# Patient Record
Sex: Female | Born: 1950 | Race: White | Hispanic: No | Marital: Married | Smoking: Former smoker
Health system: Southern US, Community
[De-identification: ages and names within clinical notes are randomized; demographics above are authoritative.]

## PROBLEM LIST (undated history)

## (undated) DIAGNOSIS — E079 Disorder of thyroid, unspecified: Secondary | ICD-10-CM

## (undated) DIAGNOSIS — C801 Malignant (primary) neoplasm, unspecified: Secondary | ICD-10-CM

## (undated) DIAGNOSIS — I10 Essential (primary) hypertension: Secondary | ICD-10-CM

## (undated) HISTORY — PX: APPENDECTOMY: SHX54

## (undated) HISTORY — PX: ABDOMINAL HYSTERECTOMY: SHX81

## (undated) HISTORY — PX: EYE SURGERY: SHX253

## (undated) HISTORY — PX: CHOLECYSTECTOMY: SHX55

## (undated) HISTORY — PX: NEPHRECTOMY: SHX65

---

## 2004-11-10 ENCOUNTER — Ambulatory Visit: Payer: Self-pay | Admitting: Unknown Physician Specialty

## 2005-07-18 ENCOUNTER — Ambulatory Visit: Payer: Self-pay | Admitting: Internal Medicine

## 2005-09-10 ENCOUNTER — Ambulatory Visit: Payer: Self-pay | Admitting: Internal Medicine

## 2006-04-30 ENCOUNTER — Encounter: Payer: Self-pay | Admitting: Internal Medicine

## 2006-05-03 ENCOUNTER — Encounter: Payer: Self-pay | Admitting: Internal Medicine

## 2006-09-23 ENCOUNTER — Ambulatory Visit: Payer: Self-pay | Admitting: Internal Medicine

## 2006-09-27 ENCOUNTER — Ambulatory Visit: Payer: Self-pay | Admitting: Internal Medicine

## 2007-04-02 ENCOUNTER — Ambulatory Visit: Payer: Self-pay | Admitting: Internal Medicine

## 2007-08-18 ENCOUNTER — Ambulatory Visit: Payer: Self-pay | Admitting: Emergency Medicine

## 2007-10-08 ENCOUNTER — Ambulatory Visit: Payer: Self-pay | Admitting: Nurse Practitioner

## 2008-10-18 ENCOUNTER — Ambulatory Visit: Payer: Self-pay | Admitting: Family Medicine

## 2009-10-24 ENCOUNTER — Ambulatory Visit: Payer: Self-pay | Admitting: Family Medicine

## 2010-07-25 ENCOUNTER — Ambulatory Visit: Payer: Self-pay | Admitting: Family Medicine

## 2010-10-10 ENCOUNTER — Ambulatory Visit: Payer: Self-pay | Admitting: Internal Medicine

## 2010-11-16 ENCOUNTER — Ambulatory Visit: Payer: Self-pay | Admitting: Family Medicine

## 2011-02-19 ENCOUNTER — Ambulatory Visit: Payer: Self-pay | Admitting: Internal Medicine

## 2011-04-22 ENCOUNTER — Ambulatory Visit: Payer: Self-pay | Admitting: Internal Medicine

## 2011-09-20 ENCOUNTER — Ambulatory Visit: Payer: Self-pay | Admitting: Internal Medicine

## 2011-12-06 ENCOUNTER — Ambulatory Visit: Payer: Self-pay | Admitting: Family Medicine

## 2011-12-19 ENCOUNTER — Ambulatory Visit: Payer: Self-pay

## 2012-05-15 ENCOUNTER — Ambulatory Visit: Payer: Self-pay | Admitting: Internal Medicine

## 2012-07-28 ENCOUNTER — Ambulatory Visit: Payer: Self-pay | Admitting: Rheumatology

## 2012-09-13 ENCOUNTER — Ambulatory Visit: Payer: Self-pay | Admitting: Internal Medicine

## 2012-10-29 ENCOUNTER — Ambulatory Visit: Payer: Self-pay | Admitting: Unknown Physician Specialty

## 2013-01-13 ENCOUNTER — Ambulatory Visit: Payer: Self-pay | Admitting: Family Medicine

## 2013-01-28 ENCOUNTER — Ambulatory Visit: Payer: Self-pay | Admitting: Family Medicine

## 2013-01-28 ENCOUNTER — Emergency Department: Payer: Self-pay | Admitting: Emergency Medicine

## 2013-01-28 LAB — BASIC METABOLIC PANEL
Anion Gap: 9 (ref 7–16)
BUN: 20 mg/dL — ABNORMAL HIGH (ref 7–18)
Chloride: 101 mmol/L (ref 98–107)
EGFR (African American): >60
Osmolality: 284 (ref 275–301)

## 2013-01-28 LAB — CK TOTAL AND CKMB (NOT AT ARMC)
CK, Total: 68 U/L (ref 21–215)
CK-MB: 0.6 ng/mL (ref 0.5–3.6)

## 2013-01-28 LAB — TROPONIN I: Troponin-I: <0.02 ng/mL

## 2013-01-28 LAB — CBC
HGB: 13.4 g/dL (ref 12.0–16.0)
MCHC: 34.1 g/dL (ref 32.0–36.0)
MCV: 94 fL (ref 80–100)
Platelet: 313 10*3/uL (ref 150–440)

## 2013-03-25 ENCOUNTER — Ambulatory Visit: Payer: Self-pay | Admitting: Family Medicine

## 2013-05-21 ENCOUNTER — Ambulatory Visit: Payer: Self-pay | Admitting: Unknown Physician Specialty

## 2013-07-03 ENCOUNTER — Ambulatory Visit: Payer: Self-pay | Admitting: Unknown Physician Specialty

## 2013-07-26 ENCOUNTER — Ambulatory Visit: Payer: Self-pay

## 2013-11-18 ENCOUNTER — Ambulatory Visit: Payer: Self-pay | Admitting: Family Medicine

## 2013-12-30 ENCOUNTER — Ambulatory Visit: Payer: Self-pay | Admitting: Family Medicine

## 2014-01-13 ENCOUNTER — Ambulatory Visit: Payer: Self-pay | Admitting: Family Medicine

## 2014-02-01 ENCOUNTER — Ambulatory Visit: Payer: Self-pay | Admitting: Hematology and Oncology

## 2014-03-03 ENCOUNTER — Ambulatory Visit: Payer: Self-pay | Admitting: Hematology and Oncology

## 2014-03-29 ENCOUNTER — Ambulatory Visit: Payer: Self-pay | Admitting: Physician Assistant

## 2014-05-10 ENCOUNTER — Ambulatory Visit: Payer: Self-pay | Admitting: Hematology and Oncology

## 2014-05-17 LAB — COMPREHENSIVE METABOLIC PANEL
ALT: 31 U/L (ref 12–78)
Albumin: 3.7 g/dL (ref 3.4–5.0)
Alkaline Phosphatase: 108 U/L
Anion Gap: 7 (ref 7–16)
BUN: 29 mg/dL — ABNORMAL HIGH (ref 7–18)
Bilirubin,Total: 0.3 mg/dL (ref 0.2–1.0)
CREATININE: 1.1 mg/dL (ref 0.60–1.30)
Calcium, Total: 9.5 mg/dL (ref 8.5–10.1)
Chloride: 102 mmol/L (ref 98–107)
Co2: 31 mmol/L (ref 21–32)
EGFR (Non-African Amer.): 53 — ABNORMAL LOW
GLUCOSE: 98 mg/dL (ref 65–99)
OSMOLALITY: 285 (ref 275–301)
Potassium: 4 mmol/L (ref 3.5–5.1)
SGOT(AST): 13 U/L — ABNORMAL LOW (ref 15–37)
Sodium: 140 mmol/L (ref 136–145)
Total Protein: 7.7 g/dL (ref 6.4–8.2)

## 2014-05-17 LAB — LACTATE DEHYDROGENASE: LDH: 166 U/L (ref 81–246)

## 2014-05-17 LAB — CBC CANCER CENTER
BASOS ABS: 0 x10 3/mm (ref 0.0–0.1)
Basophil %: 0.2 %
Eosinophil #: 0.1 x10 3/mm (ref 0.0–0.7)
Eosinophil %: 0.4 %
HCT: 47.1 % — ABNORMAL HIGH (ref 35.0–47.0)
HGB: 15.6 g/dL (ref 12.0–16.0)
Lymphocyte #: 2.8 x10 3/mm (ref 1.0–3.6)
Lymphocyte %: 18 %
MCH: 30.9 pg (ref 26.0–34.0)
MCHC: 33.2 g/dL (ref 32.0–36.0)
MCV: 93 fL (ref 80–100)
MONO ABS: 1 x10 3/mm — AB (ref 0.2–0.9)
Monocyte %: 6.2 %
Neutrophil #: 11.8 x10 3/mm — ABNORMAL HIGH (ref 1.4–6.5)
Neutrophil %: 75.2 %
PLATELETS: 321 x10 3/mm (ref 150–440)
RBC: 5.06 10*6/uL (ref 3.80–5.20)
RDW: 13.4 % (ref 11.5–14.5)
WBC: 15.7 x10 3/mm — AB (ref 3.6–11.0)

## 2014-05-20 ENCOUNTER — Ambulatory Visit: Payer: Self-pay | Admitting: Hematology and Oncology

## 2014-06-02 ENCOUNTER — Ambulatory Visit: Payer: Self-pay | Admitting: Hematology and Oncology

## 2014-06-26 ENCOUNTER — Ambulatory Visit: Payer: Self-pay | Admitting: Physician Assistant

## 2014-06-29 ENCOUNTER — Ambulatory Visit: Payer: Self-pay | Admitting: Family Medicine

## 2014-07-20 ENCOUNTER — Ambulatory Visit: Payer: Self-pay | Admitting: Unknown Physician Specialty

## 2015-08-14 ENCOUNTER — Encounter: Payer: Self-pay | Admitting: *Deleted

## 2015-08-14 ENCOUNTER — Ambulatory Visit
Admission: EM | Admit: 2015-08-14 | Discharge: 2015-08-14 | Disposition: A | Discharge status: Home / Self Care | Payer: Medicare Other | Attending: Family Medicine | Admitting: Family Medicine

## 2015-08-14 DIAGNOSIS — H6593 Unspecified nonsuppurative otitis media, bilateral: Secondary | ICD-10-CM

## 2015-08-14 DIAGNOSIS — J0101 Acute recurrent maxillary sinusitis: Secondary | ICD-10-CM

## 2015-08-14 DIAGNOSIS — J209 Acute bronchitis, unspecified: Secondary | ICD-10-CM | POA: Diagnosis not present

## 2015-08-14 HISTORY — DX: Disorder of thyroid, unspecified: E07.9

## 2015-08-14 HISTORY — DX: Malignant (primary) neoplasm, unspecified: C80.1

## 2015-08-14 HISTORY — DX: Essential (primary) hypertension: I10

## 2015-08-14 MED ORDER — AMOXICILLIN-POT CLAVULANATE 875-125 MG PO TABS: 1.0000 | ORAL_TABLET | Freq: Two times a day (BID) | ORAL | Status: DC

## 2015-08-14 MED ORDER — ACETAMINOPHEN 500 MG PO TABS: 500.0000 mg | ORAL_TABLET | Freq: Four times a day (QID) | ORAL | Status: AC | PRN

## 2015-08-14 MED ORDER — FLUTICASONE PROPIONATE 50 MCG/ACT NA SUSP: 1.0000 | Freq: Two times a day (BID) | NASAL | Status: AC

## 2015-08-14 MED ORDER — SALINE SPRAY 0.65 % NA SOLN: 2.0000 | NASAL | Status: AC

## 2015-08-14 MED ORDER — BENZONATATE 200 MG PO CAPS: 200.0000 mg | ORAL_CAPSULE | Freq: Three times a day (TID) | ORAL | Status: DC | PRN

## 2015-08-14 MED ORDER — HYDROCOD POLST-CPM POLST ER 10-8 MG/5ML PO SUER: 5.0000 mL | Freq: Every evening | ORAL | Status: DC | PRN

## 2015-08-14 NOTE — ED Notes (Signed)
Pt states she has cough, a sinus headache, ear pressure that started a couple of days ago

## 2015-08-14 NOTE — Discharge Instructions (Signed)
Acute Bronchitis Bronchitis is inflammation of the airways that extend from the windpipe into the lungs (bronchi). The inflammation often causes mucus to develop. This leads to a cough, which is the most common symptom of bronchitis.  In acute bronchitis, the condition usually develops suddenly and goes away over time, usually in a couple weeks. Smoking, allergies, and asthma can make bronchitis worse. Repeated episodes of bronchitis may cause further lung problems.  CAUSES Acute bronchitis is most often caused by the same virus that causes a cold. The virus can spread from person to person (contagious) through coughing, sneezing, and touching contaminated objects. SIGNS AND SYMPTOMS   Cough.   Fever.   Coughing up mucus.   Body aches.   Chest congestion.   Chills.   Shortness of breath.   Sore throat.  DIAGNOSIS  Acute bronchitis is usually diagnosed through a physical exam. Your health care provider will also ask you questions about your medical history. Tests, such as chest X-rays, are sometimes done to rule out other conditions.  TREATMENT  Acute bronchitis usually goes away in a couple weeks. Oftentimes, no medical treatment is necessary. Medicines are sometimes given for relief of fever or cough. Antibiotic medicines are usually not needed but may be prescribed in certain situations. In some cases, an inhaler may be recommended to help reduce shortness of breath and control the cough. A cool mist vaporizer may also be used to help thin bronchial secretions and make it easier to clear the chest.  HOME CARE INSTRUCTIONS  Get plenty of rest.   Drink enough fluids to keep your urine clear or pale yellow (unless you have a medical condition that requires fluid restriction). Increasing fluids may help thin your respiratory secretions (sputum) and reduce chest congestion, and it will prevent dehydration.   Take medicines only as directed by your health care provider.  If  you were prescribed an antibiotic medicine, finish it all even if you start to feel better.  Avoid smoking and secondhand smoke. Exposure to cigarette smoke or irritating chemicals will make bronchitis worse. If you are a smoker, consider using nicotine gum or skin patches to help control withdrawal symptoms. Quitting smoking will help your lungs heal faster.   Reduce the chances of another bout of acute bronchitis by washing your hands frequently, avoiding people with cold symptoms, and trying not to touch your hands to your mouth, nose, or eyes.   Keep all follow-up visits as directed by your health care provider.  SEEK MEDICAL CARE IF: Your symptoms do not improve after 1 week of treatment.  SEEK IMMEDIATE MEDICAL CARE IF:  You develop an increased fever or chills.   You have chest pain.   You have severe shortness of breath.  You have bloody sputum.   You develop dehydration.  You faint or repeatedly feel like you are going to pass out.  You develop repeated vomiting.  You develop a severe headache. MAKE SURE YOU:   Understand these instructions.  Will watch your condition.  Will get help right away if you are not doing well or get worse. Document Released: 12/27/2004 Document Revised: 04/05/2014 Document Reviewed: 05/12/2013 [facility] Patient Information 2015 [place], [place]. This information is not intended to replace advice given to you by your health care provider. Make sure you discuss any questions you have with your health care provider. Otitis Media With Effusion Otitis media with effusion is the presence of fluid in the middle ear. This is a common problem in children,  which often follows ear infections. It may be present for weeks or longer after the infection. Unlike an acute ear infection, otitis media with effusion refers only to fluid behind the ear drum and not infection. Children with repeated ear and sinus infections and allergy problems are the most  likely to get otitis media with effusion. CAUSES  The most frequent cause of the fluid buildup is dysfunction of the eustachian tubes. These are the tubes that drain fluid in the ears to the back of the nose (nasopharynx). SYMPTOMS   The main symptom of this condition is hearing loss. As a result, you or your child may:  Listen to the TV at a loud volume.  Not respond to questions.  Ask "what" often when spoken to.  Mistake or confuse one sound or word for another.  There may be a sensation of fullness or pressure but usually not pain. DIAGNOSIS   Your health care provider will diagnose this condition by examining you or your child's ears.  Your health care provider may test the pressure in you or your child's ear with a tympanometer.  A hearing test may be conducted if the problem persists. TREATMENT   Treatment depends on the duration and the effects of the effusion.  Antibiotics, decongestants, nose drops, and cortisone-type drugs (tablets or nasal spray) may not be helpful.  Children with persistent ear effusions may have delayed language or behavioral problems. Children at risk for developmental delays in hearing, learning, and speech may require referral to a specialist earlier than children not at risk.  You or your child's health care provider may suggest a referral to an ear, nose, and throat surgeon for treatment. The following may help restore normal hearing:  Drainage of fluid.  Placement of ear tubes (tympanostomy tubes).  Removal of adenoids (adenoidectomy). HOME CARE INSTRUCTIONS   Avoid secondhand smoke.  Infants who are breastfed are less likely to have this condition.  Avoid feeding infants while they are lying flat.  Avoid known environmental allergens.  Avoid people who are sick. SEEK MEDICAL CARE IF:   Hearing is not better in 3 months.  Hearing is worse.  Ear pain.  Drainage from the ear.  Dizziness. MAKE SURE YOU:   Understand these  instructions.  Will watch your condition.  Will get help right away if you are not doing well or get worse. Document Released: 12/27/2004 Document Revised: 04/05/2014 Document Reviewed: 06/16/2013 [facility] Patient Information 2015 [place], [place]. This information is not intended to replace advice given to you by your health care provider. Make sure you discuss any questions you have with your health care provider. Sinusitis Sinusitis is redness, soreness, and inflammation of the paranasal sinuses. Paranasal sinuses are air pockets within the bones of your face (beneath the eyes, the middle of the forehead, or above the eyes). In healthy paranasal sinuses, mucus is able to drain out, and air is able to circulate through them by way of your nose. However, when your paranasal sinuses are inflamed, mucus and air can become trapped. This can allow bacteria and other germs to grow and cause infection. Sinusitis can develop quickly and last only a short time (acute) or continue over a long period (chronic). Sinusitis that lasts for more than 12 weeks is considered chronic.  CAUSES  Causes of sinusitis include:  Allergies.  Structural abnormalities, such as displacement of the cartilage that separates your nostrils (deviated septum), which can decrease the air flow through your nose and sinuses and affect  sinus drainage.  Functional abnormalities, such as when the small hairs (cilia) that line your sinuses and help remove mucus do not work properly or are not present. SIGNS AND SYMPTOMS  Symptoms of acute and chronic sinusitis are the same. The primary symptoms are pain and pressure around the affected sinuses. Other symptoms include:  Upper toothache.  Earache.  Headache.  Bad breath.  Decreased sense of smell and taste.  A cough, which worsens when you are lying flat.  Fatigue.  Fever.  Thick drainage from your nose, which often is green and may contain pus (purulent).  Swelling and  warmth over the affected sinuses. DIAGNOSIS  Your health care provider will perform a physical exam. During the exam, your health care provider may:  Look in your nose for signs of abnormal growths in your nostrils (nasal polyps).  Tap over the affected sinus to check for signs of infection.  View the inside of your sinuses (endoscopy) using an imaging device that has a light attached (endoscope). If your health care provider suspects that you have chronic sinusitis, one or more of the following tests may be recommended:  Allergy tests.  Nasal culture. A sample of mucus is taken from your nose, sent to a lab, and screened for bacteria.  Nasal cytology. A sample of mucus is taken from your nose and examined by your health care provider to determine if your sinusitis is related to an allergy. TREATMENT  Most cases of acute sinusitis are related to a viral infection and will resolve on their own within 10 days. Sometimes medicines are prescribed to help relieve symptoms (pain medicine, decongestants, nasal steroid sprays, or saline sprays).  However, for sinusitis related to a bacterial infection, your health care provider will prescribe antibiotic medicines. These are medicines that will help kill the bacteria causing the infection.  Rarely, sinusitis is caused by a fungal infection. In theses cases, your health care provider will prescribe antifungal medicine. For some cases of chronic sinusitis, surgery is needed. Generally, these are cases in which sinusitis recurs more than 3 times per year, despite other treatments. HOME CARE INSTRUCTIONS   Drink plenty of water. Water helps thin the mucus so your sinuses can drain more easily.  Use a humidifier.  Inhale steam 3 to 4 times a day (for example, sit in the bathroom with the shower running).  Apply a warm, moist washcloth to your face 3 to 4 times a day, or as directed by your health care provider.  Use saline nasal sprays to help  moisten and clean your sinuses.  Take medicines only as directed by your health care provider.  If you were prescribed either an antibiotic or antifungal medicine, finish it all even if you start to feel better. SEEK IMMEDIATE MEDICAL CARE IF:  You have increasing pain or severe headaches.  You have nausea, vomiting, or drowsiness.  You have swelling around your face.  You have vision problems.  You have a stiff neck.  You have difficulty breathing. MAKE SURE YOU:   Understand these instructions.  Will watch your condition.  Will get help right away if you are not doing well or get worse. Document Released: 11/19/2005 Document Revised: 04/05/2014 Document Reviewed: 12/04/2011 [facility] Patient Information 2015 [place], [place]. This information is not intended to replace advice given to you by your health care provider. Make sure you discuss any questions you have with your health care provider.

## 2015-08-14 NOTE — ED Provider Notes (Signed)
CSN: [id number]     Arrival date & time 08/14/15  1036 History   First MD Initiated Contact with Patient 08/14/15 1123     Chief Complaint  Patient presents with  . Cough  . Headache   (Consider location/radiation/quality/duration/timing/severity/associated sxs/prior Treatment) HPI Comments: Married caucasian female here for evaluation of sinus congestion, cough, headache, insomnia due to cough, ear pressure.  Kidney cancer was in remission 13 years recurred 2015 off chemo x 2 months.  Delsym not helping cough. Ran out of tussionex 3 days ago, leftover from illness last year and would like refill, cough and postnasal drip keep waking her up, ears feel full, glands in neck swollen.  Takes claritin typically in the summer for seasonal allergies prefers azithromycin for sinus infections/bronchitis.  The history is provided by the patient.    Past Medical History  Diagnosis Date  . Thyroid disease   . Hypertension   . Cancer     renal cell    Past Surgical History  Procedure Laterality Date  . Abdominal hysterectomy    . Cholecystectomy    . Appendectomy    . Nephrectomy     No family history on file. Social History  Substance Use Topics  . Smoking status: Former Research scientist (life sciences)  . Smokeless tobacco: None  . Alcohol Use: No   OB History    No data available     Review of Systems  Constitutional: Negative for fever, chills, diaphoresis, activity change, appetite change and fatigue.  HENT: Positive for congestion, ear pain, postnasal drip, rhinorrhea and sinus pressure. Negative for dental problem, drooling, ear discharge, facial swelling, hearing loss, mouth sores, nosebleeds, sneezing, sore throat, tinnitus, trouble swallowing and voice change.   Eyes: Negative for photophobia, pain, discharge, redness, itching and visual disturbance.  Respiratory: Positive for cough and chest tightness. Negative for choking, shortness of breath, wheezing and stridor.   Cardiovascular: Negative for  chest pain, palpitations and leg swelling.  Gastrointestinal: Negative for nausea, vomiting, abdominal pain, diarrhea, constipation, blood in stool and abdominal distention.  Endocrine: Negative for cold intolerance and heat intolerance.  Genitourinary: Negative for dysuria.  Musculoskeletal: Negative for myalgias, back pain, joint swelling, arthralgias, gait problem, neck pain and neck stiffness.  Skin: Negative for color change, pallor, rash and wound.  Allergic/Immunologic: Positive for environmental allergies. Negative for food allergies.  Neurological: Positive for headaches. Negative for dizziness, tremors, seizures, syncope, facial asymmetry, speech difficulty, weakness, light-headedness and numbness.  Hematological: Negative for adenopathy. Does not bruise/bleed easily.  Psychiatric/Behavioral: Positive for sleep disturbance. Negative for behavioral problems, confusion and agitation.    Allergies  Lisinopril  Home Medications   Prior to Admission medications   Medication Sig Start Date End Date Taking? Authorizing Provider  cyanocobalamin 1000 MCG tablet Take 100 mcg by mouth daily.   Yes Historical Provider, MD  levothyroxine (SYNTHROID, LEVOTHROID) 25 MCG tablet Take 12.5 mcg by mouth daily before breakfast.   Yes Historical Provider, MD  magnesium 30 MG tablet Take 30 mg by mouth 2 (two) times daily.   Yes Historical Provider, MD  olmesartan-hydrochlorothiazide (BENICAR HCT) 40-12.5 MG per tablet Take 1 tablet by mouth daily.   Yes Historical Provider, MD  POTASSIUM CHLORIDE PO Take by mouth.   Yes Historical Provider, MD  acetaminophen (TYLENOL) 500 MG tablet Take 1 tablet (500 mg total) by mouth every 6 (six) hours as needed for moderate pain or fever. 08/14/15   Olen Cordial, NP  amoxicillin-clavulanate (AUGMENTIN) 875-125 MG per tablet Take 1  tablet by mouth every 12 (twelve) hours. 08/14/15   Olen Cordial, NP  benzonatate (TESSALON) 200 MG capsule Take 1 capsule (200  mg total) by mouth 3 (three) times daily as needed for cough. 08/14/15   Olen Cordial, NP  chlorpheniramine-HYDROcodone (TUSSIONEX PENNKINETIC ER) 10-8 MG/5ML SUER Take 5 mLs by mouth at bedtime as needed for cough. 08/14/15   Olen Cordial, NP  fluticasone (FLONASE) 50 MCG/ACT nasal spray Place 1 spray into both nostrils 2 (two) times daily. 08/14/15   Olen Cordial, NP  sodium chloride (OCEAN) 0.65 % SOLN nasal spray Place 2 sprays into both nostrils every 2 (two) hours while awake. 08/14/15   Olen Cordial, NP   Meds Ordered and Administered this Visit  Medications - No data to display  BP 140/75 mmHg  Pulse 101  Temp(Src) 98 F (36.7 C) (Oral)  Ht 5' 2.5" (1.588 m)  Wt 131 lb (59.421 kg)  BMI 23.56 kg/m2  SpO2 100% No data found.   Physical Exam  Constitutional: She is oriented to person, place, and time. Vital signs are normal. She appears well-developed and well-nourished. No distress.  HENT:  Head: Normocephalic and atraumatic.  Right Ear: Hearing, external ear and ear canal normal. A middle ear effusion is present.  Left Ear: Hearing, external ear and ear canal normal. A middle ear effusion is present.  Nose: Mucosal edema and rhinorrhea present. No nose lacerations, sinus tenderness, nasal deformity, septal deviation or nasal septal hematoma. No epistaxis.  No foreign bodies. Right sinus exhibits maxillary sinus tenderness. Right sinus exhibits no frontal sinus tenderness. Left sinus exhibits maxillary sinus tenderness. Left sinus exhibits no frontal sinus tenderness.  Mouth/Throat: Uvula is midline and mucous membranes are normal. Mucous membranes are not pale, not dry and not cyanotic. She does not have dentures. No oral lesions. No trismus in the jaw. Normal dentition. No dental abscesses, uvula swelling, lacerations or dental caries. Posterior oropharyngeal edema and posterior oropharyngeal erythema present. No oropharyngeal exudate or tonsillar abscesses.   Bilateral TMs with air fluid level; cobblestoning posterior pharynx; bilateral turbinates with edema/erythema  Eyes: Conjunctivae, EOM and lids are normal. Pupils are equal, round, and reactive to light. Right eye exhibits no discharge. Left eye exhibits no discharge. No scleral icterus.  Neck: Trachea normal and normal range of motion. Neck supple. No tracheal deviation present.  Cardiovascular: Normal rate, regular rhythm, normal heart sounds and intact distal pulses.  Exam reveals no gallop and no friction rub.   No murmur heard. Pulmonary/Chest: Effort normal. No stridor. No respiratory distress. She has decreased breath sounds in the right lower field and the left lower field. She has no wheezes. She has no rhonchi. She has no rales. She exhibits no tenderness.  Negative egophany all fields  Abdominal: Soft. Bowel sounds are normal. She exhibits no shifting dullness, no distension, no pulsatile liver, no fluid wave, no abdominal bruit, no ascites, no pulsatile midline mass and no mass. There is no hepatosplenomegaly. There is no tenderness. There is no rigidity, no rebound, no guarding, no tenderness at McBurney's point and negative Murphy's sign. Hernia confirmed negative in the ventral area.  Dull to percussion x 4 quads  Musculoskeletal: Normal range of motion. She exhibits no edema or tenderness.  Lymphadenopathy:    She has no cervical adenopathy.  Neurological: She is alert and oriented to person, place, and time. She displays no atrophy and no tremor. No cranial nerve deficit or sensory deficit. She exhibits normal muscle  tone. She displays no seizure activity. Coordination and gait normal. GCS eye subscore is 4. GCS verbal subscore is 5. GCS motor subscore is 6.  Skin: Skin is warm, dry and intact. No abrasion, no bruising, no burn, no ecchymosis, no laceration, no lesion, no petechiae and no rash noted. She is not diaphoretic. No cyanosis or erythema. No pallor. Nails show no clubbing.   Psychiatric: She has a normal mood and affect. Her speech is normal and behavior is normal. Judgment and thought content normal. Cognition and memory are normal.  Nursing note and vitals reviewed.   ED Course  Procedures (including critical care time)  Labs Review Labs Reviewed - No data to display  Imaging Review No results found.     MDM   1. Acute recurrent maxillary sinusitis   2. Otitis media with effusion, bilateral   3. Acute bronchitis, unspecified organism    augmentin 875mg  po BID x 10 days.  flonase 1 spray each nostril BID. Nasal saline 2 sprays each nostril q2h while awake  No evidence of systemic bacterial infection, non toxic and well hydrated.  I do not see where any further testing or imaging is necessary at this time.   I will suggest supportive care, rest, good hygiene and encourage the patient to take adequate fluids.  The patient is to return to clinic or EMERGENCY ROOM if symptoms worsen or change significantly.  Exitcare handout on sinusitis given to patient.  Patient verbalized agreement and understanding of treatment plan and had no further questions at this time.   P2:  Hand washing and cover cough  Supportive treatment.   No evidence of invasive bacterial infection, non toxic and well hydrated.  This is most likely self limiting viral infection.  I do not see where any further testing or imaging is necessary at this time.   I will suggest supportive care, rest, good hygiene and encourage the patient to take adequate fluids.  The patient is to return to clinic or EMERGENCY ROOM if symptoms worsen or change significantly e.g. ear pain, fever, purulent discharge from ears or bleeding.  Exitcare handout on otitis media with effusion given to patient.  Patient verbalized agreement and understanding of treatment plan.    Tussionex and tessalon pearles refilled for patient Rx given.  Bronchitis simple, community acquired, may have started as viral (probably  respiratory syncytial, parainfluenza, influenza, or adenovirus), but now evidence of acute purulent bronchitis with resultant bronchial edema and mucus formation.  Differential Diagnoses:  Reactive Airway Disease (asthma, allergic aspergillosis eosinophilia), chronic bronchitis, respiratory infection (sinusitis, common cold, pneumonia), congestive heart failure, smoke/irritant exposure, reflux esophagitis, bronchogenic tumor, and/or aspiration syndromes.  Treating concurently for sinus infection with augmentin 875mg  po BID and flonase 1 spray each nostril BID.  Without high fever, severe dyspnea and lack of physical findings or risk factors will hold on chest radiograph and CBC at this time.  I discussed that approximately 50% of patients with acute bronchitis have a cough that lasts up to three weeks, and 25% for over a month. Tylenol, one to two tablets every four hours as needed for fever or myalgias.   No aspirin. Patient instructed to follow up in one week or sooner if symptoms worsen. Patient verbalized agreement and understanding of treatment plan.  P2:  hand washing and cover cough    Olen Cordial, NP 08/15/15 2111

## 2016-05-23 ENCOUNTER — Other Ambulatory Visit
Admission: RE | Admit: 2016-05-23 | Discharge: 2016-05-23 | Disposition: A | Discharge status: Home / Self Care | Payer: Medicare Other | Source: Ambulatory Visit | Attending: Unknown Physician Specialty | Admitting: Unknown Physician Specialty

## 2016-05-23 DIAGNOSIS — M1711 Unilateral primary osteoarthritis, right knee: Secondary | ICD-10-CM | POA: Insufficient documentation

## 2016-05-23 LAB — SYNOVIAL FLUID, CRYSTAL: Crystals, Fluid: NONE SEEN

## 2016-05-24 ENCOUNTER — Other Ambulatory Visit: Payer: Self-pay | Admitting: Unknown Physician Specialty

## 2016-05-24 DIAGNOSIS — M1711 Unilateral primary osteoarthritis, right knee: Secondary | ICD-10-CM

## 2016-05-29 LAB — BODY FLUID CULTURE: Culture: NO GROWTH

## 2016-06-12 ENCOUNTER — Ambulatory Visit
Admission: RE | Admit: 2016-06-12 | Discharge: 2016-06-12 | Disposition: A | Discharge status: Home / Self Care | Payer: Medicare Other | Source: Ambulatory Visit | Attending: Unknown Physician Specialty | Admitting: Unknown Physician Specialty

## 2016-06-12 DIAGNOSIS — M659 Synovitis and tenosynovitis, unspecified: Secondary | ICD-10-CM | POA: Diagnosis not present

## 2016-06-12 DIAGNOSIS — S83241A Other tear of medial meniscus, current injury, right knee, initial encounter: Secondary | ICD-10-CM | POA: Diagnosis not present

## 2016-06-12 DIAGNOSIS — Z9889 Other specified postprocedural states: Secondary | ICD-10-CM | POA: Diagnosis not present

## 2016-06-12 DIAGNOSIS — X58XXXA Exposure to other specified factors, initial encounter: Secondary | ICD-10-CM | POA: Diagnosis not present

## 2016-06-12 DIAGNOSIS — M25461 Effusion, right knee: Secondary | ICD-10-CM | POA: Insufficient documentation

## 2016-06-12 DIAGNOSIS — M1711 Unilateral primary osteoarthritis, right knee: Secondary | ICD-10-CM | POA: Insufficient documentation

## 2016-10-08 IMAGING — MR MR KNEE*R* W/O CM
6 series · 36 of 40 positions shown · non-contrast
Comparison: 05/21/2013

CLINICAL DATA: Twisting injury 2 years ago. History of arthroscopic
knee surgery 7378. Lateral knee pain and swelling.

EXAM:
MRI OF THE RIGHT KNEE WITHOUT CONTRAST
TECHNIQUE: Multiplanar, multisequence MR imaging of the knee was performed. No
intravenous contrast was administered.

[Series 3: PD fat-sat · axial · 3.0mm · 0.31mm/px · z∈[-59,+40]mm · 9 of 31 slices shown (1 of 4)]
[im 1/31]
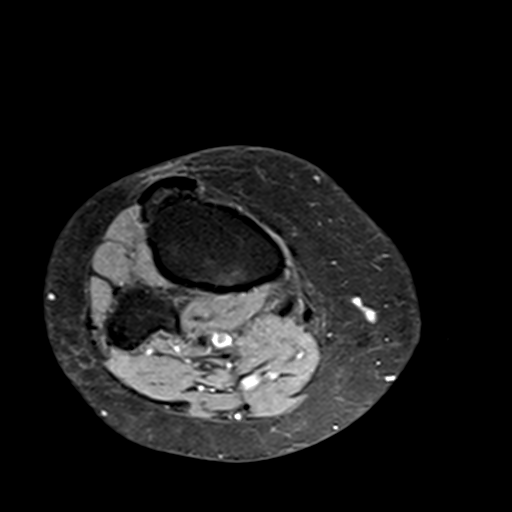
[im 4/31]
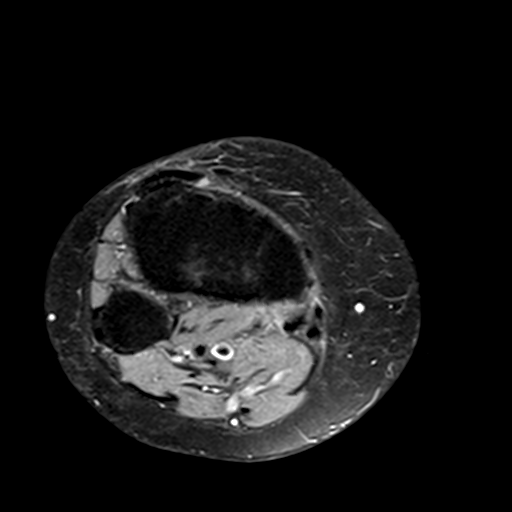
[im 8/31]
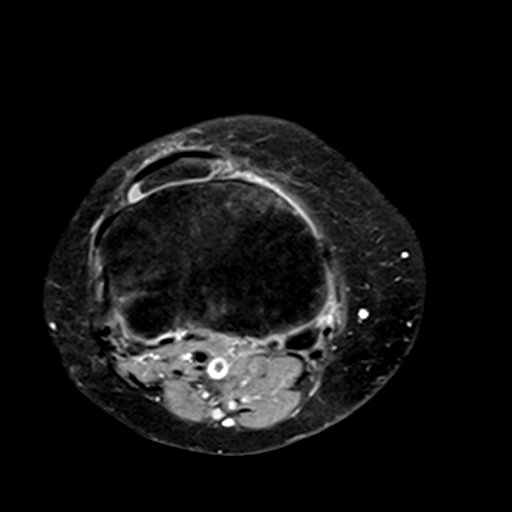
[im 12/31]
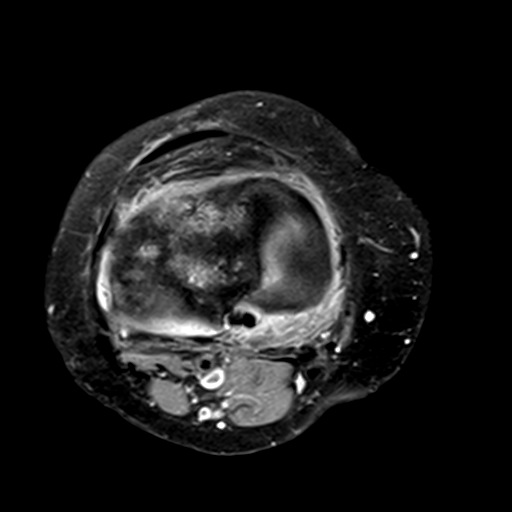
[im 16/31]
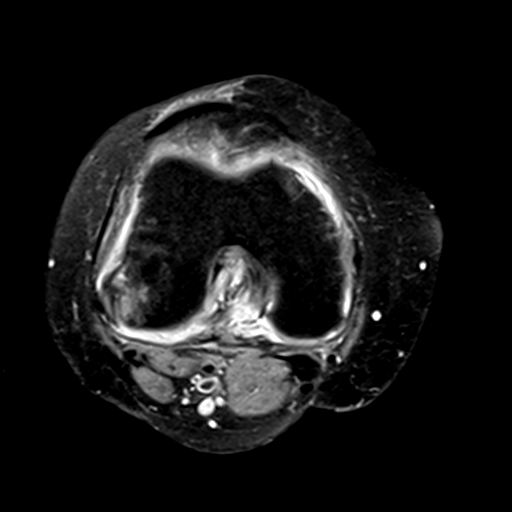
[im 19/31]
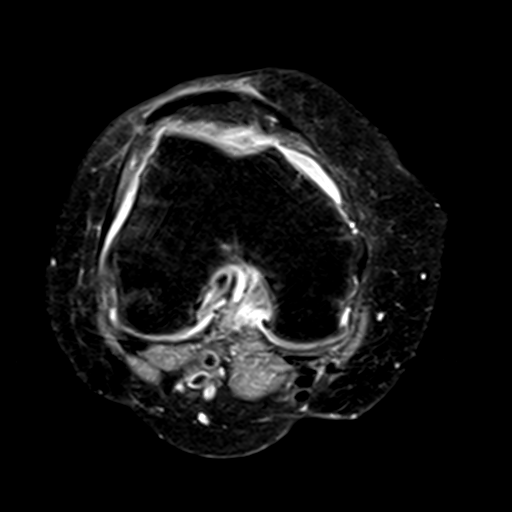
[im 23/31]
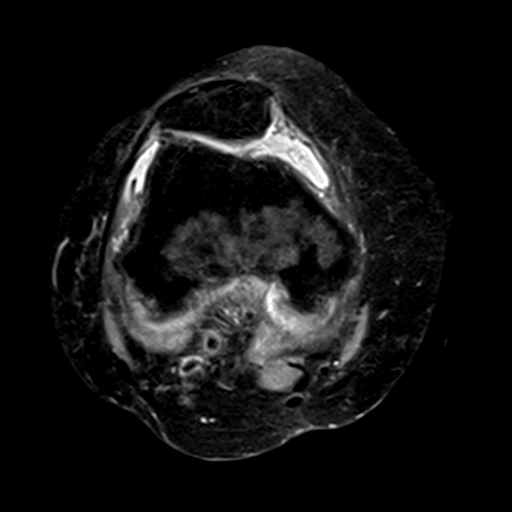
[im 27/31]
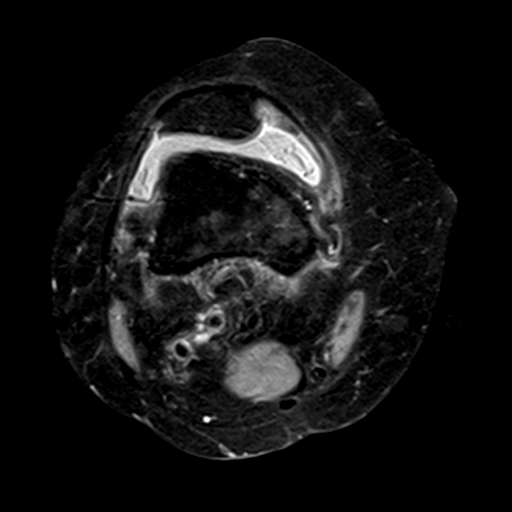
[im 31/31]
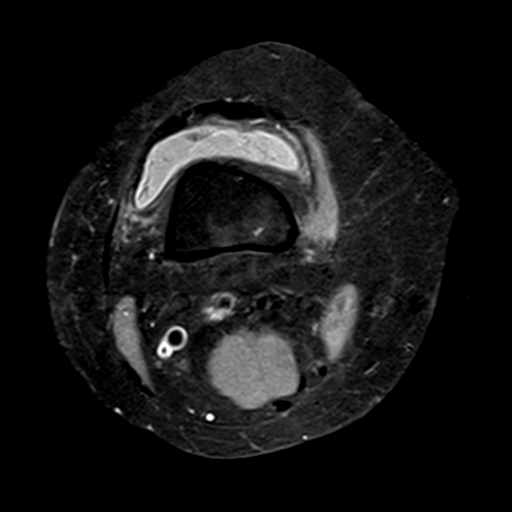

[Series 4: T1 · coronal · 3.0mm · 0.50mm/px · 3 of 30 slices shown]
[im 1/30]
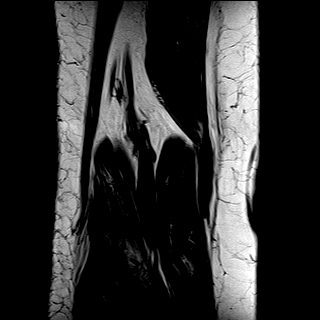
[im 5/30]
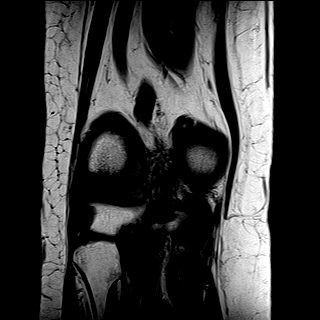
[im 10/30]
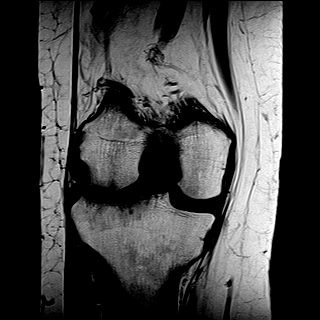

[Series 5: T2 fat-sat · coronal · 3.0mm · 0.50mm/px · 7 of 30 slices shown]
[im 1/30]
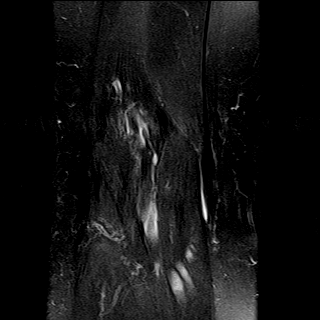
[im 5/30]
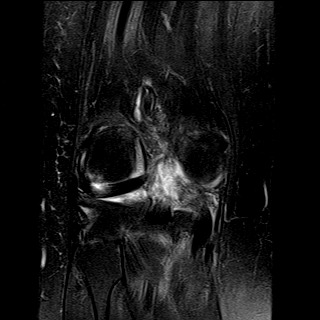
[im 10/30]
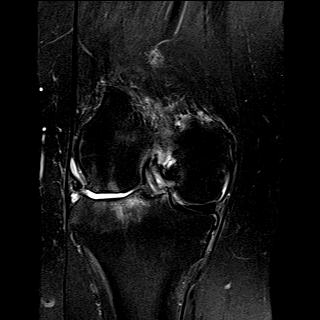
[im 15/30]
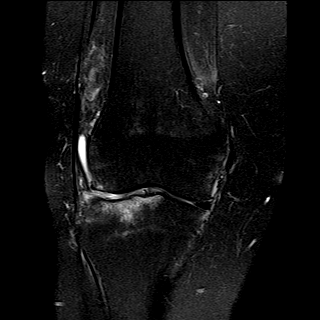
[im 20/30]
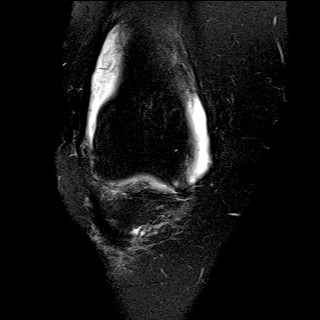
[im 25/30]
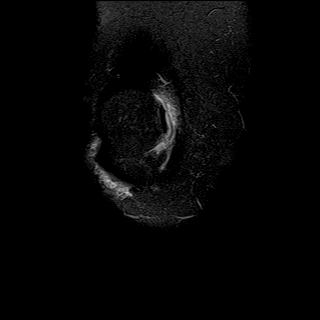
[im 30/30]
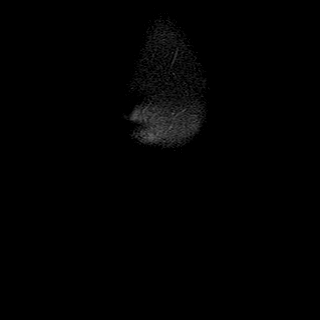

[Series 6: PD fat-sat · sagittal · 3.0mm · 0.62mm/px · 7 of 29 slices shown (2 of 4)]
[im 1/29]
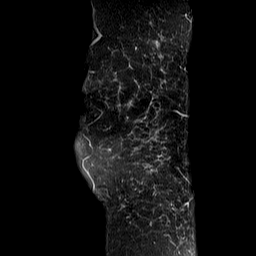
[im 5/29]
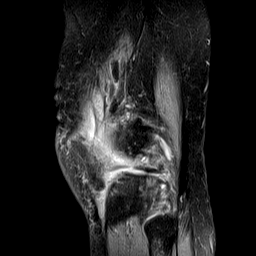
[im 10/29]
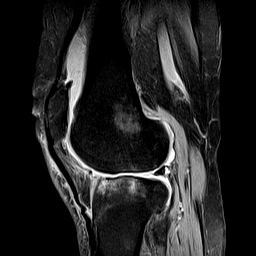
[im 15/29]
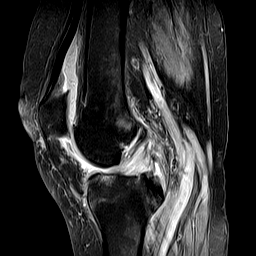
[im 19/29]
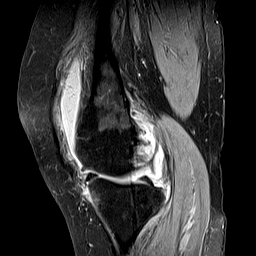
[im 24/29]
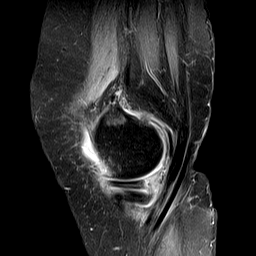
[im 29/29]
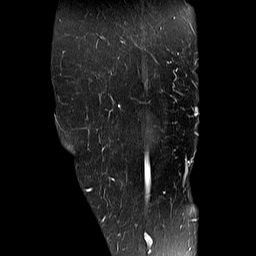

[Series 7: PD fat-sat · coronal · 3.0mm · 0.62mm/px · 7 of 30 slices shown (3 of 4)]
[im 1/30]
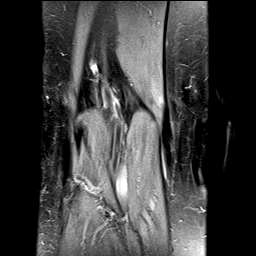
[im 5/30]
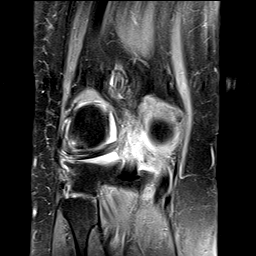
[im 10/30]
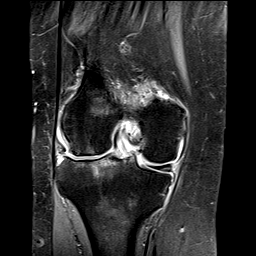
[im 15/30]
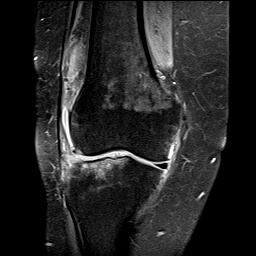
[im 20/30]
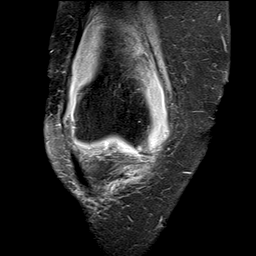
[im 25/30]
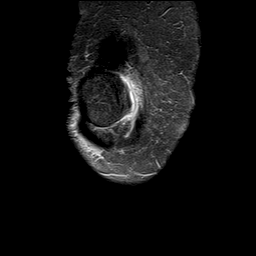
[im 30/30]
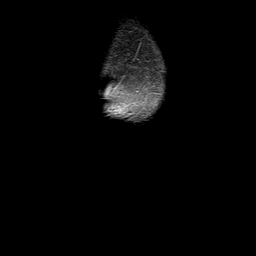

[Series 8: PD fat-sat · oblique · 2.0mm · 0.62mm/px · 3 of 12 slices shown (4 of 4)]
[im 1/12]
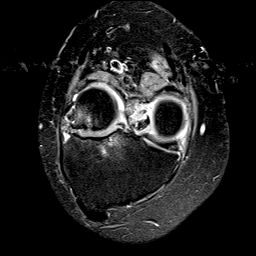
[im 6/12]
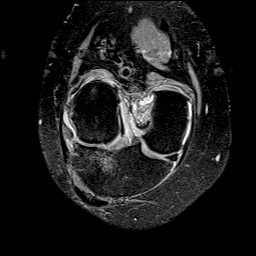
[im 12/12]
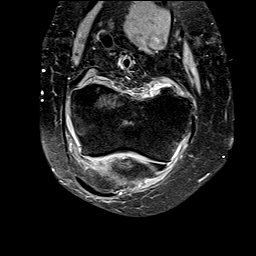

[36 of 40 positions shown; findings below may reference images not displayed]

FINDINGS: MENISCI

Medial meniscus: Intrasubstance degenerative changes and shallow
inferior articular surface tear involving the mid body region.

Lateral meniscus: Chronic surgical changes with a fairly extensive
partial meniscectomy involving the anterior horn. There is a new
oblique coursing superior articular surface tear involving the
posterior horn extending up into the mid body region.

LIGAMENTS

Cruciates: Chronic severe mucoid degeneration of the ACL. The PCL is
intact. Moderate mucoid degeneration.

Collaterals:  Intact.

CARTILAGE

Patellofemoral: Moderate to advanced degenerative chondrosis/
chondromalacia.

Medial: Moderate degenerative chondrosis with early joint space
narrowing and spurring.

Lateral: Advanced degenerative chondrosis with areas of full or near
full-thickness cartilage loss, joint space narrowing, osteophytic
spurring, subchondral cystic change and subchondral edema. These
findings are progressive.

Joint: Large joint effusion and moderate synovitis. Superior and
medial patellar plica are noted.

Popliteal Fossa:  No popliteal mass or Baker's cyst.

Extensor Mechanism: The patella retinacular structures are intact
and the quadriceps and patellar tendons are intact.

Bones:  No acute bony findings.

Other: None
IMPRESSION: 1. Surgical changes from a fairly extensive partial lateral
meniscectomy mainly involving the anterior horn. There is a new
fairly extensive oblique coursing tear involving the posterior horn
and mid body region.
2. Shallow inferior articular surface tear involving the mid body
region of the medial meniscus.
3. Tricompartmental degenerative changes most significant in the
lateral compartment.
4. Chronic mucoid degeneration of the ACL and PCL.
5. Large joint effusion and moderate synovitis.

## 2017-04-10 ENCOUNTER — Ambulatory Visit
Admission: EM | Admit: 2017-04-10 | Discharge: 2017-04-10 | Disposition: A | Discharge status: Home / Self Care | Payer: Medicare Other | Attending: Family Medicine | Admitting: Family Medicine

## 2017-04-10 DIAGNOSIS — Z90710 Acquired absence of both cervix and uterus: Secondary | ICD-10-CM | POA: Diagnosis not present

## 2017-04-10 DIAGNOSIS — Z9049 Acquired absence of other specified parts of digestive tract: Secondary | ICD-10-CM | POA: Insufficient documentation

## 2017-04-10 DIAGNOSIS — Z87891 Personal history of nicotine dependence: Secondary | ICD-10-CM | POA: Insufficient documentation

## 2017-04-10 DIAGNOSIS — E079 Disorder of thyroid, unspecified: Secondary | ICD-10-CM | POA: Insufficient documentation

## 2017-04-10 DIAGNOSIS — Z905 Acquired absence of kidney: Secondary | ICD-10-CM | POA: Insufficient documentation

## 2017-04-10 DIAGNOSIS — J0100 Acute maxillary sinusitis, unspecified: Secondary | ICD-10-CM

## 2017-04-10 DIAGNOSIS — Z8553 Personal history of malignant neoplasm of renal pelvis: Secondary | ICD-10-CM | POA: Insufficient documentation

## 2017-04-10 DIAGNOSIS — I10 Essential (primary) hypertension: Secondary | ICD-10-CM | POA: Insufficient documentation

## 2017-04-10 DIAGNOSIS — R509 Fever, unspecified: Secondary | ICD-10-CM | POA: Diagnosis present

## 2017-04-10 LAB — RAPID STREP SCREEN (MED CTR MEBANE ONLY): Streptococcus, Group A Screen (Direct): NEGATIVE

## 2017-04-10 MED ORDER — AMOXICILLIN 875 MG PO TABS: 875.0000 mg | ORAL_TABLET | Freq: Two times a day (BID) | ORAL | 0 refills | Status: DC

## 2017-04-10 NOTE — ED Provider Notes (Signed)
[facility]    CSN: [id number] Arrival date & time: 04/10/17  1118     History   Chief Complaint Chief Complaint  Patient presents with  . Fever    HPI Sheri Wolf is a 66 y.o. female.   The history is provided by the patient.  Fever  Associated symptoms: congestion and cough   URI  Presenting symptoms: congestion, cough, facial pain and fever   Severity:  Moderate Onset quality:  Sudden Duration:  6 days Timing:  Constant Progression:  Worsening Chronicity:  New Relieved by:  Nothing Ineffective treatments:  OTC medications Associated symptoms: sinus pain   Associated symptoms: no wheezing   Risk factors: being elderly   Risk factors: no chronic cardiac disease, no chronic kidney disease, no chronic respiratory disease, no diabetes mellitus, no immunosuppression, no recent illness and no recent travel     Past Medical History:  Diagnosis Date  . Cancer ([place])    renal cell   . Hypertension   . Thyroid disease     There are no active problems to display for this patient.   Past Surgical History:  Procedure Laterality Date  . ABDOMINAL HYSTERECTOMY    . APPENDECTOMY    . CHOLECYSTECTOMY    . NEPHRECTOMY      OB History    No data available       Home Medications    Prior to Admission medications   Medication Sig Start Date End Date Taking? Authorizing Provider  acetaminophen (TYLENOL) 500 MG tablet Take 1 tablet (500 mg total) by mouth every 6 (six) hours as needed for moderate pain or fever. 08/14/15  Yes Betancourt, Aura Fey, NP  diazepam (DIASTAT ACUDIAL) 10 MG GEL Place rectally once.   Yes [provider]  FLUoxetine (PROZAC) 10 MG tablet Take 10 mg by mouth daily.   Yes [provider]  levETIRAcetam (KEPPRA) 500 MG tablet Take 500 mg by mouth 2 (two) times daily.   Yes [provider]  levothyroxine (SYNTHROID, LEVOTHROID) 25 MCG tablet Take 12.5 mcg by mouth daily before breakfast.   Yes [provider]  losartan-hydrochlorothiazide (HYZAAR) 100-12.5 MG tablet Take 1 tablet by mouth daily.   Yes [provider]  magnesium 30 MG tablet Take 30 mg by mouth 2 (two) times daily.   Yes [provider]  morphine (MSIR) 15 MG tablet Take 15 mg by mouth every 4 (four) hours as needed for severe pain.   Yes [provider]  morphine (MSIR) 30 MG tablet Take 30 mg by mouth every 12 (twelve) hours as needed for severe pain.   Yes [provider]  predniSONE (DELTASONE) 10 MG tablet Take 10 mg by mouth daily with breakfast.   Yes [provider]  ranitidine (ZANTAC) 150 MG tablet Take 150 mg by mouth 2 (two) times daily.   Yes [provider]  triamcinolone lotion (KENALOG) 0.1 % Apply 1 application topically 3 (three) times daily.   Yes [provider]  amoxicillin (AMOXIL) 875 MG tablet Take 1 tablet (875 mg total) by mouth 2 (two) times daily. 04/10/17   Norval Gable, MD  amoxicillin-clavulanate (AUGMENTIN) 875-125 MG per tablet Take 1 tablet by mouth every 12 (twelve) hours. 08/14/15   Betancourt, Aura Fey, NP  benzonatate (TESSALON) 200 MG capsule Take 1 capsule (200 mg total) by mouth 3 (three) times daily as needed for cough. 08/14/15   Betancourt, Aura Fey, NP  chlorpheniramine-HYDROcodone (TUSSIONEX PENNKINETIC ER) 10-8  MG/5ML SUER Take 5 mLs by mouth at bedtime as needed for cough. 08/14/15   Betancourt, Aura Fey, NP  cyanocobalamin 1000 MCG tablet Take 100 mcg by mouth daily.    [provider]  fluticasone (FLONASE) 50 MCG/ACT nasal spray Place 1 spray into both nostrils 2 (two) times daily. 08/14/15   Betancourt, Aura Fey, NP  olmesartan-hydrochlorothiazide (BENICAR HCT) 40-12.5 MG per tablet Take 1 tablet by mouth daily.    [provider]  POTASSIUM CHLORIDE PO Take by mouth.    [provider]  sodium chloride (OCEAN) 0.65 % SOLN nasal spray Place 2 sprays into both nostrils every 2 (two) hours while  awake. 08/14/15   Betancourt, Aura Fey, NP    Family History History reviewed. No pertinent family history.  Social History Social History  Substance Use Topics  . Smoking status: Former Research scientist (life sciences)  . Smokeless tobacco: Never Used  . Alcohol use No     Allergies   Lisinopril   Review of Systems Review of Systems  Constitutional: Positive for fever.  HENT: Positive for congestion and sinus pain.   Respiratory: Positive for cough. Negative for wheezing.      Physical Exam Triage Vital Signs ED Triage Vitals  Enc Vitals Group     BP 04/10/17 1206 138/64     Pulse Rate 04/10/17 1206 (!) 110     Resp 04/10/17 1206 18     Temp 04/10/17 1206 98.7 F (37.1 C)     Temp Source 04/10/17 1206 Oral     SpO2 04/10/17 1206 98 %     Weight 04/10/17 1201 144 lb (65.3 kg)     Height 04/10/17 1201 5' 1.5" (1.562 m)     Head Circumference --      Peak Flow --      Pain Score 04/10/17 1201 7     Pain Loc --      Pain Edu? --      Excl. in [place]? --    No data found.   Updated Vital Signs BP 138/64 (BP Location: Left Arm)   Pulse (!) 110   Temp 98.7 F (37.1 C) (Oral) Comment: took tylenol at 8am  Resp 18   Ht 5' 1.5" (1.562 m)   Wt 144 lb (65.3 kg)   SpO2 98%   BMI 26.77 kg/m   Visual Acuity Right Eye Distance:   Left Eye Distance:   Bilateral Distance:    Right Eye Near:   Left Eye Near:    Bilateral Near:     Physical Exam  Constitutional: She appears well-developed and well-nourished. No distress.  HENT:  Head: Normocephalic and atraumatic.  Right Ear: Tympanic membrane, external ear and ear canal normal.  Left Ear: Tympanic membrane, external ear and ear canal normal.  Nose: Mucosal edema and rhinorrhea present. No nose lacerations, sinus tenderness, nasal deformity, septal deviation or nasal septal hematoma. No epistaxis.  No foreign bodies. Right sinus exhibits maxillary sinus tenderness and frontal sinus tenderness. Left sinus exhibits maxillary sinus tenderness  and frontal sinus tenderness.  Mouth/Throat: Uvula is midline, oropharynx is clear and moist and mucous membranes are normal. No oropharyngeal exudate.  Eyes: Conjunctivae and EOM are normal. Pupils are equal, round, and reactive to light. Right eye exhibits no discharge. Left eye exhibits no discharge. No scleral icterus.  Neck: Normal range of motion. Neck supple. No thyromegaly present.  Cardiovascular: Normal rate, regular rhythm and normal heart sounds.   Pulmonary/Chest: Effort normal and breath sounds  normal. No respiratory distress. She has no wheezes. She has no rales.  Lymphadenopathy:    She has no cervical adenopathy.  Skin: She is not diaphoretic.  Nursing note and vitals reviewed.    UC Treatments / Results  Labs (all labs ordered are listed, but only abnormal results are displayed) Labs Reviewed  RAPID STREP SCREEN (NOT AT [facility])  CULTURE, GROUP A STREP [facility])    EKG  EKG Interpretation None       Radiology No results found.  Procedures Procedures (including critical care time)  Medications Ordered in UC Medications - No data to display   Initial Impression / Assessment and Plan / UC Course  I have reviewed the triage vital signs and the nursing notes.  Pertinent labs & imaging results that were available during my care of the patient were reviewed by me and considered in my medical decision making (see chart for details).       Final Clinical Impressions(s) / UC Diagnoses   Final diagnoses:  Acute maxillary sinusitis, recurrence not specified    New Prescriptions Discharge Medication List as of 04/10/2017  1:07 PM    START taking these medications   Details  amoxicillin (AMOXIL) 875 MG tablet Take 1 tablet (875 mg total) by mouth 2 (two) times daily., Starting Wed 04/10/2017, Normal       1 diagnosis reviewed with patient 2. rx as per orders above; reviewed possible side effects, interactions, risks and benefits  3. Follow-up prn if symptoms  worsen or don't improve   Norval Gable, MD 04/10/17 1402

## 2017-04-10 NOTE — ED Triage Notes (Signed)
Patient complains of fever, sore throat, hoarseness. Patient states that symptoms started yesterday morning.

## 2017-04-13 LAB — CULTURE, GROUP A STREP (THRC)

## 2018-08-24 ENCOUNTER — Other Ambulatory Visit: Payer: Self-pay

## 2018-08-24 ENCOUNTER — Ambulatory Visit
Admission: EM | Admit: 2018-08-24 | Discharge: 2018-08-24 | Disposition: A | Discharge status: Home / Self Care | Payer: Medicare Other | Attending: Family Medicine | Admitting: Family Medicine

## 2018-08-24 ENCOUNTER — Encounter: Payer: Self-pay | Admitting: Gynecology

## 2018-08-24 DIAGNOSIS — R110 Nausea: Secondary | ICD-10-CM | POA: Diagnosis not present

## 2018-08-24 DIAGNOSIS — R197 Diarrhea, unspecified: Secondary | ICD-10-CM | POA: Diagnosis not present

## 2018-08-24 NOTE — ED Provider Notes (Signed)
[facility]    CSN: [id number] Arrival date & time: 08/24/18  1201     History   Chief Complaint Chief Complaint  Patient presents with  . Diarrhea  . Hiatal Hernia    HPI Sheri Wolf is a 67 y.o. female.   67 yo female with a c/o nausea and watery diarrhea since last night. Denies any fevers, chills, abdominal pain, melena, hematochezia. No recent travel or known sick contacts, however did eat out yesterday. Patient states she has a hiatal hernia and is wondering if it could be related to her symptoms.  The history is provided by the patient.    Past Medical History:  Diagnosis Date  . Cancer ([place])    renal cell   . Hypertension   . Thyroid disease     There are no active problems to display for this patient.   Past Surgical History:  Procedure Laterality Date  . ABDOMINAL HYSTERECTOMY    . APPENDECTOMY    . CHOLECYSTECTOMY    . NEPHRECTOMY      OB History   None      Home Medications    Prior to Admission medications   Medication Sig Start Date End Date Taking? Authorizing Provider  acetaminophen (TYLENOL) 500 MG tablet Take 1 tablet (500 mg total) by mouth every 6 (six) hours as needed for moderate pain or fever. 08/14/15  Yes Betancourt, Aura Fey, NP  cyanocobalamin 1000 MCG tablet Take 100 mcg by mouth daily.   Yes [provider]  diazepam (DIASTAT ACUDIAL) 10 MG GEL Place rectally once.   Yes [provider]  FLUoxetine (PROZAC) 10 MG tablet Take 10 mg by mouth daily.   Yes [provider]  fluticasone (FLONASE) 50 MCG/ACT nasal spray Place 1 spray into both nostrils 2 (two) times daily. 08/14/15  Yes Betancourt, Aura Fey, NP  levETIRAcetam (KEPPRA) 500 MG tablet Take 500 mg by mouth 2 (two) times daily.   Yes [provider]  levothyroxine (SYNTHROID, LEVOTHROID) 25 MCG tablet Take 12.5 mcg by mouth daily before breakfast.   Yes [provider]  losartan-hydrochlorothiazide (HYZAAR) 100-12.5 MG  tablet Take 1 tablet by mouth daily.   Yes [provider]  magnesium 30 MG tablet Take 30 mg by mouth 2 (two) times daily.   Yes [provider]  morphine (MSIR) 15 MG tablet Take 15 mg by mouth every 4 (four) hours as needed for severe pain.   Yes [provider]  morphine (MSIR) 30 MG tablet Take 30 mg by mouth every 12 (twelve) hours as needed for severe pain.   Yes [provider]  naloxone [facility]) 2 MG/2ML injection Inject into the muscle. 04/01/17  Yes [provider]  olmesartan-hydrochlorothiazide (BENICAR HCT) 40-12.5 MG per tablet Take 1 tablet by mouth daily.   Yes [provider]  POTASSIUM CHLORIDE PO Take by mouth.   Yes [provider]  predniSONE (DELTASONE) 10 MG tablet Take 10 mg by mouth daily with breakfast.   Yes [provider]  ranitidine (ZANTAC) 150 MG tablet Take 150 mg by mouth 2 (two) times daily.   Yes [provider]  sodium chloride (OCEAN) 0.65 % SOLN nasal spray Place 2 sprays into both nostrils every 2 (two) hours while awake. 08/14/15  Yes Betancourt, Aura Fey, NP  triamcinolone lotion (KENALOG) 0.1 % Apply 1 application topically 3 (three) times daily.   Yes [provider]  amoxicillin (AMOXIL) 875 MG tablet Take 1  tablet (875 mg total) by mouth 2 (two) times daily. 04/10/17   Norval Gable, MD  amoxicillin-clavulanate (AUGMENTIN) 875-125 MG per tablet Take 1 tablet by mouth every 12 (twelve) hours. 08/14/15   Betancourt, Aura Fey, NP  benzonatate (TESSALON) 200 MG capsule Take 1 capsule (200 mg total) by mouth 3 (three) times daily as needed for cough. 08/14/15   Betancourt, Aura Fey, NP  chlorpheniramine-HYDROcodone (TUSSIONEX PENNKINETIC ER) 10-8 MG/5ML SUER Take 5 mLs by mouth at bedtime as needed for cough. 08/14/15   Betancourt, Aura Fey, NP    Family History History reviewed. No pertinent family history.  Social History Social History   Tobacco Use  . Smoking status:  Former Research scientist (life sciences)  . Smokeless tobacco: Never Used  Substance Use Topics  . Alcohol use: No  . Drug use: No     Allergies   Lisinopril   Review of Systems Review of Systems   Physical Exam Triage Vital Signs ED Triage Vitals  Enc Vitals Group     BP 08/24/18 1228 (!) 149/96     Pulse Rate 08/24/18 1228 (!) 105     Resp 08/24/18 1228 16     Temp 08/24/18 1228 98.2 F (36.8 C)     Temp Source 08/24/18 1228 Oral     SpO2 08/24/18 1228 96 %     Weight 08/24/18 1222 151 lb (68.5 kg)     Height 08/24/18 1222 5\' 2"  (1.575 m)     Head Circumference --      Peak Flow --      Pain Score 08/24/18 1222 6     Pain Loc --      Pain Edu? --      Excl. in [place]? --    No data found.  Updated Vital Signs BP (!) 149/96 (BP Location: Left Arm)   Pulse (!) 105   Temp 98.2 F (36.8 C) (Oral)   Resp 16   Ht 5\' 2"  (1.575 m)   Wt 68.5 kg   SpO2 96%   BMI 27.62 kg/m   Visual Acuity Right Eye Distance:   Left Eye Distance:   Bilateral Distance:    Right Eye Near:   Left Eye Near:    Bilateral Near:     Physical Exam  Constitutional: She appears well-developed and well-nourished. No distress.  Pulmonary/Chest: Effort normal. No stridor. No respiratory distress.  Abdominal: Soft. Bowel sounds are normal. She exhibits no distension and no mass. There is no tenderness. There is no rebound and no guarding. No hernia.  Skin: No rash noted. She is not diaphoretic.  Nursing note and vitals reviewed.    UC Treatments / Results  Labs (all labs ordered are listed, but only abnormal results are displayed) Labs Reviewed - No data to display  EKG None  Radiology No results found.  Procedures Procedures (including critical care time)  Medications Ordered in UC Medications - No data to display  Initial Impression / Assessment and Plan / UC Course  I have reviewed the triage vital signs and the nursing notes.  Pertinent labs & imaging results that were available during my care  of the patient were reviewed by me and considered in my medical decision making (see chart for details).      Final Clinical Impressions(s) / UC Diagnoses   Final diagnoses:  Diarrhea, unspecified type  Nausea     Discharge Instructions     Imodium AD as needed for diarrhea Zofran 8mg  as needed every 8  hours Clear liquids today then advance slowly as tolerated    ED Prescriptions    None     1. Labs/x-ray results and diagnosis reviewed with patient/parent/guardian/family 2. rx as per orders above; reviewed possible side effects, interactions, risks and benefits  3. Recommend supportive treatment as above 4. Follow-up prn if symptoms worsen or don't improve    Controlled Substance Prescriptions [place] Controlled Substance Registry consulted? Not Applicable   Norval Gable, MD 08/24/18 [id number]

## 2018-08-24 NOTE — ED Triage Notes (Signed)
Patient c/o diarrhea and nausea x 2 days. Per patient with hiatal hernia.

## 2018-08-24 NOTE — Discharge Instructions (Signed)
Imodium AD as needed for diarrhea Zofran 8mg  as needed every 8 hours Clear liquids today then advance slowly as tolerated

## 2019-05-30 ENCOUNTER — Encounter: Payer: Self-pay | Admitting: Emergency Medicine

## 2019-05-30 ENCOUNTER — Ambulatory Visit
Admission: EM | Admit: 2019-05-30 | Discharge: 2019-05-30 | Disposition: A | Discharge status: Home / Self Care | Payer: Medicare Other | Attending: Family Medicine | Admitting: Family Medicine

## 2019-05-30 ENCOUNTER — Other Ambulatory Visit: Payer: Self-pay

## 2019-05-30 DIAGNOSIS — R1013 Epigastric pain: Secondary | ICD-10-CM

## 2019-05-30 DIAGNOSIS — R109 Unspecified abdominal pain: Secondary | ICD-10-CM | POA: Diagnosis not present

## 2019-05-30 MED ORDER — HYDROCODONE-ACETAMINOPHEN 5-325 MG PO TABS
1.0000 | ORAL_TABLET | Freq: Three times a day (TID) | ORAL | 0 refills | Status: AC | PRN
Start: 2019-05-30 — End: ?

## 2019-05-30 NOTE — Discharge Instructions (Signed)
Pain medication as directed.  Call your Doctor Monday am for referral.  Take care  Dr. Lacinda Axon

## 2019-05-30 NOTE — ED Triage Notes (Signed)
Patient c/o left sided upper abdominal pain off and on for 2 weeks.  Patient states that she saw her PCP on June 4 and her H. Pylori test was Negative.  Patient states that she is receiving cancer treatment.  Patient denies N/V/D.  Patient denies fevers.  Patient denies hard stools.

## 2019-05-31 NOTE — ED Provider Notes (Signed)
[facility]    CSN: [id number] Arrival date & time: 05/30/19  1355      History   Chief Complaint Chief Complaint  Patient presents with  . Abdominal Pain    HPI    68 year old female presents with abdominal pain.  Patient has extensive past medical history.  She has had ongoing abdominal pain for the past month.  She has been seen by her primary care physician.  H. pylori testing was negative.  She was placed on Carafate.  Patient has been continuing Protonix.  Patient reports continued abdominal pain.  Located in the epigastric region and left upper quadrant.  Worse with past 2 weeks.  Patient is unsure of the cause.  No nausea, vomiting, diarrhea.  She is concerned that it may be from the fact that her pain medications have been changed.  She has went from a substantial amount of long-acting and short acting morphine to hydrocodone.  She is now out of her pain medication.  Her pain is 7/10 in severity.  Denies constipation.  No other associated symptoms.  No other complaints.  History reviewed and updated as below. PMH: Hypertension    GERD (gastroesophageal reflux disease)    Irritable bowel syndrome    Renal cell carcinoma (CMS-HCC)    Mitral regurgitation    Rosacea    Hemorrhoids    Psoriatic arthritis (CMS-HCC) [place] a. S/p Sulfasalazine/nausea/vomiting.  Osteoarthritis [place] a. Knees.  Diverticulitis    Psoriasis    Allergic state    Anxiety    Renal cell carcinoma (CMS-HCC)    Maintenance chemotherapy following disease    History of chemotherapy 2019 chemo is for kidney and lung cancer  Thyroid disease    Heart murmur, unspecified    Bleeding disorder (CMS-HCC)  on blood thinner Lovenox  Seizures (CMS-HCC)      Surgical Hx HYSTERECTOMY      CHOLECYSTECTOMY      APPENDECTOMY      NEPHRECTOMY      KNEE ARTHROSCOPY 07/03/2013 Right Arthroscopic partial lateral meniscectomy.   COLONOSCOPY 11/10/2004  Dr.  Kennis Carina @ [place], [facility](m)(80), rpt 5 yrs per RTE   BRONCHOSCOPY W/TRANSBRONCHIAL BIOPSY FLEXIBLE 07/21/2014 Bilateral Procedure: Benjie Karvonen BX; Surgeon: Cherlynn Kaiser, MD; Location: [facility]; Service: Pulmonary; Laterality: Bilateral; Linear EBUS   ENDOBRONCHIAL ULTRASOUND 07/21/2014 N/A Procedure: ENDOBRONCHIAL ULTRASOUND; Surgeon: Cherlynn Kaiser, MD; Location: [facility]; Service: Pulmonary; Laterality: N/A;   right kidney excision 12/03/2001 - 12/02/2002 Right    OOPHORECTOMY      FRACTURE SURGERY      ABDOMINAL SURGERY   Gall bladder surgery   ESOPHAGOGASTRODOUDENOSCOPY W/BIOPSY 10/16/2018 Esophagus/N/A Procedure: Upper Endoscopy; Surgeon: Gershon Crane, MD; Location[facility]; Service: Gastroenterology; Laterality: N/A;   SIGMOIDOSCOPY FLEXIBLE 10/16/2018 N/A Procedure: SIGMOIDOSCOPY FLEXIBLE; Surgeon: Gershon Crane, MD; Location[facility]; Service: Gastroenterology; Laterality: N/A;   EXTRACTION CATARACT EXTRACAPSULAR W/INSERTION INTRAOCULAR PROSTHESIS 05/21/2019 Eye/Right Procedure: R- LENSX ORA -EXTRACTION CATARACT EXTRACAPSULAR W/INSERTION INTRAOCULAR PROSTHESIS; Surgeon: Doyce Para, MD; Location: [facility]; Service: Ophthalmology; Laterality: Right; LenSx/ORA  Medical devices from this surgery are in the Implants section.   EXTRACTION CATARACT EXTRACAPSULAR W/INSERTION INTRAOCULAR PROSTHESIS 05/28/2019 Eye/Left Procedure: L- LENSX ORA - EXTRACTION CATARACT EXTRACAPSULAR W/INSERTION INTRAOCULAR PROSTHESIS; Surgeon: Doyce Para, MD; Location: [facility]; Service: Ophthalmology; Laterality: Left; LenSx/ORA  Medical devices from this surgery are in the Implants section.       OB History   No obstetric history on file.  Home Medications    Prior to Admission medications   Medication Sig Start Date End Date Taking? Authorizing Provider  ALPRAZolam (XANAX) 0.25 MG tablet TAKE 1  TABLET BY MOUTH TWICE DAILY AS NEEDED FOR SLEEP 12/24/18  Yes [provider]  amLODipine (NORVASC) 2.5 MG tablet Take by mouth. 03/11/19 03/10/20 Yes [provider]  cyanocobalamin 1000 MCG tablet Take 100 mcg by mouth daily.   Yes [provider]  FLUoxetine (PROZAC) 10 MG tablet Take 10 mg by mouth daily.   Yes [provider]  fluticasone (FLONASE) 50 MCG/ACT nasal spray Place 1 spray into both nostrils 2 (two) times daily. 08/14/15  Yes Betancourt, Aura Fey, NP  levETIRAcetam (KEPPRA) 500 MG tablet Take 500 mg by mouth 2 (two) times daily.   Yes [provider]  levothyroxine (SYNTHROID, LEVOTHROID) 25 MCG tablet Take 12.5 mcg by mouth daily before breakfast.   Yes [provider]  losartan-hydrochlorothiazide (HYZAAR) 100-12.5 MG tablet Take 1 tablet by mouth daily.   Yes [provider]  magnesium 30 MG tablet Take 30 mg by mouth 2 (two) times daily.   Yes [provider]  pantoprazole (PROTONIX) 40 MG tablet Take by mouth. 05/06/19 11/02/19 Yes [provider]  POTASSIUM CHLORIDE PO Take by mouth.   Yes [provider]  acetaminophen (TYLENOL) 500 MG tablet Take 1 tablet (500 mg total) by mouth every 6 (six) hours as needed for moderate pain or fever. 08/14/15   Betancourt, Tina A, NP  BROMSITE 0.075 % SOLN INSTILL 1 DROP INTO THE OPERATIVE EYE TWICE DAILY BEGINNING AFTER SURGERY AND CONTINUE FOR 4 WEEKS 05/21/19   [provider]  diazepam (DIASTAT ACUDIAL) 10 MG GEL Place rectally once.    [provider]  HYDROcodone-acetaminophen (NORCO/VICODIN) 5-325 MG tablet Take 1 tablet by mouth every 8 (eight) hours as needed. 05/30/19   Coral Spikes, DO  naloxone [facility]) 2 MG/2ML injection Inject into the muscle. 04/01/17   [provider]  sodium chloride (OCEAN) 0.65 % SOLN nasal spray Place 2 sprays into both nostrils every 2 (two) hours while awake. 08/14/15   Betancourt, Aura Fey, NP   triamcinolone lotion (KENALOG) 0.1 % Apply 1 application topically 3 (three) times daily.    [provider]  olmesartan-hydrochlorothiazide (BENICAR HCT) 40-12.5 MG per tablet Take 1 tablet by mouth daily.  05/30/19  [provider]  ranitidine (ZANTAC) 150 MG tablet Take 150 mg by mouth 2 (two) times daily.  05/30/19  [provider]   Social History Social History   Tobacco Use  . Smoking status: Former Research scientist (life sciences)  . Smokeless tobacco: Never Used  Substance Use Topics  . Alcohol use: No  . Drug use: No     Allergies   Lisinopril   Review of Systems Review of Systems  Constitutional: Negative.   Gastrointestinal: Positive for abdominal pain.   Physical Exam Triage Vital Signs ED Triage Vitals  Enc Vitals Group     BP 05/30/19 1416 134/82     Pulse Rate 05/30/19 1416 93     Resp 05/30/19 1416 16     Temp 05/30/19 1416 98.2 F (36.8 C)     Temp Source 05/30/19 1416 Oral     SpO2 05/30/19 1416 98 %     Weight 05/30/19 1411 130 lb (59 kg)     Height 05/30/19 1411 5' (1.524 m)     Head Circumference --      Peak Flow --  Pain Score 05/30/19 1411 7     Pain Loc --      Pain Edu? --      Excl. in [place]? --    Updated Vital Signs BP 134/82 (BP Location: Right Arm)   Pulse 93   Temp 98.2 F (36.8 C) (Oral)   Resp 16   Ht 5' (1.524 m)   Wt 59 kg   SpO2 98%   BMI 25.39 kg/m   Visual Acuity Right Eye Distance:   Left Eye Distance:   Bilateral Distance:    Right Eye Near:   Left Eye Near:    Bilateral Near:     Physical Exam Vitals signs and nursing note reviewed.  Constitutional:      Comments: Frail elderly female in no acute distress.  HENT:     Head: Normocephalic and atraumatic.  Eyes:     General:        Right eye: No discharge.        Left eye: No discharge.     Conjunctiva/sclera: Conjunctivae normal.  Cardiovascular:     Rate and Rhythm: Normal rate and regular rhythm.  Pulmonary:     Effort: Pulmonary effort is  normal.     Breath sounds: Normal breath sounds.  Abdominal:     Comments: Soft, Nondistended.  Tender to palpation in the epigastric region as well as left side of the abdomen.  Psychiatric:        Mood and Affect: Mood normal.        Behavior: Behavior normal.    UC Treatments / Results  Labs (all labs ordered are listed, but only abnormal results are displayed) Labs Reviewed - No data to display  EKG None  Radiology No results found.  Procedures Procedures (including critical care time)  Medications Ordered in UC Medications - No data to display  Initial Impression / Assessment and Plan / UC Course  I have reviewed the triage vital signs and the nursing notes.  Pertinent labs & imaging results that were available during my care of the patient were reviewed by me and considered in my medical decision making (see chart for details).    68 year old female presents with abdominal pain.  Etiology uncertain at this time.  Believe she is having some effects from recent change in her pain medications.  Have given her a brief supply of hydrocodone.  I have advised her to call her primary care physician on Monday to arrange for referral to GI.  [place] controlled substance database reviewed.  Final Clinical Impressions(s) / UC Diagnoses   Final diagnoses:  Epigastric pain  Left sided abdominal pain     Discharge Instructions     Pain medication as directed.  Call your Doctor Monday am for referral.  Take care  Dr. Lacinda Axon    ED Prescriptions    Medication Sig [place]. Provider   HYDROcodone-acetaminophen (NORCO/VICODIN) 5-325 MG tablet Take 1 tablet by mouth every 8 (eight) hours as needed. 10 tablet Coral Spikes, DO     Controlled Substance Prescriptions [place] Controlled Substance Registry consulted? Yes, I have consulted the [place] Controlled Substances Registry for this patient, and feel the risk/benefit ratio today is favorable for proceeding with this  prescription for a controlled substance.   Coral Spikes, DO 05/31/19 1010
